# Patient Record
Sex: Male | Born: 1985 | Race: White | Hispanic: No | Marital: Married | Smoking: Never smoker
Health system: Southern US, Community
[De-identification: ages and names within clinical notes are randomized; demographics above are authoritative.]

---

## 2015-08-27 ENCOUNTER — Emergency Department (HOSPITAL_COMMUNITY)
Admission: EM | Admit: 2015-08-27 | Discharge: 2015-08-27 | Disposition: A | Discharge status: Home / Self Care | Payer: BLUE CROSS/BLUE SHIELD | Attending: Emergency Medicine | Admitting: Emergency Medicine

## 2015-08-27 ENCOUNTER — Emergency Department (HOSPITAL_COMMUNITY): Payer: BLUE CROSS/BLUE SHIELD

## 2015-08-27 ENCOUNTER — Encounter (HOSPITAL_COMMUNITY): Payer: Self-pay | Admitting: *Deleted

## 2015-08-27 DIAGNOSIS — M791 Myalgia: Secondary | ICD-10-CM | POA: Insufficient documentation

## 2015-08-27 DIAGNOSIS — R110 Nausea: Secondary | ICD-10-CM | POA: Insufficient documentation

## 2015-08-27 DIAGNOSIS — R109 Unspecified abdominal pain: Secondary | ICD-10-CM | POA: Insufficient documentation

## 2015-08-27 DIAGNOSIS — R079 Chest pain, unspecified: Secondary | ICD-10-CM | POA: Diagnosis present

## 2015-08-27 DIAGNOSIS — R42 Dizziness and giddiness: Secondary | ICD-10-CM | POA: Insufficient documentation

## 2015-08-27 LAB — CBC
HCT: 45.4 % (ref 39.0–52.0)
Hemoglobin: 15.9 g/dL (ref 13.0–17.0)
MCH: 29.8 pg (ref 26.0–34.0)
MCHC: 35 g/dL (ref 30.0–36.0)
MCV: 85 fL (ref 78.0–100.0)
PLATELETS: 242 10*3/uL (ref 150–400)
RBC: 5.34 MIL/uL (ref 4.22–5.81)
RDW: 12.3 % (ref 11.5–15.5)
WBC: 6.5 10*3/uL (ref 4.0–10.5)

## 2015-08-27 LAB — BASIC METABOLIC PANEL
Anion gap: 11 (ref 5–15)
BUN: 15 mg/dL (ref 6–20)
CALCIUM: 10.2 mg/dL (ref 8.9–10.3)
CHLORIDE: 99 mmol/L — AB (ref 101–111)
CO2: 30 mmol/L (ref 22–32)
CREATININE: 1.52 mg/dL — AB (ref 0.61–1.24)
GFR calc Af Amer: >60 mL/min (ref >60)
GFR calc non Af Amer: >60 mL/min (ref >60)
GLUCOSE: 108 mg/dL — AB (ref 65–99)
Potassium: 4.2 mmol/L (ref 3.5–5.1)
Sodium: 140 mmol/L (ref 135–145)

## 2015-08-27 LAB — I-STAT TROPONIN, ED: TROPONIN I, POC: 0 ng/mL (ref 0.00–0.08)

## 2015-08-27 MED ORDER — ACETAMINOPHEN 500 MG PO TABS: 500.0000 mg | ORAL_TABLET | Freq: Four times a day (QID) | ORAL | Status: AC | PRN

## 2015-08-27 NOTE — ED Notes (Signed)
Pt c/o sudden left sided chest pain, started about an hour prior to arrival to ED. Pt denies chest pain at this time. Pt only report lightheadedness and dizziness. Pt states the pain was sharp lasting a few seconds. Pt states this is the third time this has happened.

## 2015-08-27 NOTE — Discharge Instructions (Signed)
1. Medications: tylenol, usual home medications 2. Treatment: rest, drink plenty of fluids 3. Follow Up: please followup with your primary doctor for discussion of your diagnoses and further evaluation after today's visit; if you do not have a primary care doctor use the resource guide provided to find one; please return to the ER for severe pain or shortness of breath, high fever, loss of consciousness, new or worsening symptoms   Nonspecific Chest Pain It is often hard to find the cause of chest pain. There is always a chance that your pain could be related to something serious, such as a heart attack or a blood clot in your lungs. Chest pain can also be caused by conditions that are not life-threatening. If you have chest pain, it is very important to follow up with your doctor.  HOME CARE  If you were prescribed an antibiotic medicine, finish it all even if you start to feel better.  Avoid any activities that cause chest pain.  Do not use any tobacco products, including cigarettes, chewing tobacco, or electronic cigarettes. If you need help quitting, ask your doctor.  Do not drink alcohol.  Take medicines only as told by your doctor.  Keep all follow-up visits as told by your doctor. This is important. This includes any further testing if your chest pain does not go away.  Your doctor may tell you to keep your head raised (elevated) while you sleep.  Make lifestyle changes as told by your doctor. These may include:  Getting regular exercise. Ask your doctor to suggest some activities that are safe for you.  Eating a heart-healthy diet. Your doctor or a diet specialist (dietitian) can help you to learn healthy eating options.  Maintaining a healthy weight.  Managing diabetes, if necessary.  Reducing stress. GET HELP IF:  Your chest pain does not go away, even after treatment.  You have a rash with blisters on your chest.  You have a fever. GET HELP RIGHT AWAY IF:  Your  chest pain is worse.  You have an increasing cough, or you cough up blood.  You have severe belly (abdominal) pain.  You feel extremely weak.  You pass out (faint).  You have chills.  You have sudden, unexplained chest discomfort.  You have sudden, unexplained discomfort in your arms, back, neck, or jaw.  You have shortness of breath at any time.  You suddenly start to sweat, or your skin gets clammy.  You feel nauseous.  You vomit.  You suddenly feel light-headed or dizzy.  Your heart begins to beat quickly, or it feels like it is skipping beats. These symptoms may be an emergency. Do not wait to see if the symptoms will go away. Get medical help right away. Call your local emergency services (911 in the U.S.). Do not drive yourself to the hospital.   This information is not intended to replace advice given to you by your health care provider. Make sure you discuss any questions you have with your health care provider.   Document Released: 04/04/2008 Document Revised: 11/07/2014 Document Reviewed: 05/23/2014 Elsevier Interactive Patient Education 2016 [employer].   Emergency Department Resource Guide 1) Find a Doctor and Pay Out of Pocket Although you won't have to find out who is covered by your insurance plan, it is a good idea to ask around and get recommendations. You will then need to call the office and see if the doctor you have chosen will accept you as a new patient and  what types of options they offer for patients who are self-pay. Some doctors offer discounts or will set up payment plans for their patients who do not have insurance, but you will need to ask so you aren't surprised when you get to your appointment.  2) Contact Your Local Health Department Not all health departments have doctors that can see patients for sick visits, but many do, so it is worth a call to see if yours does. If you don't know where your local health department is, you can check in  your phone book. The CDC also has a tool to help you locate your state's health department, and many state websites also have listings of all of their local health departments.  3) Find a Walk-in Clinic If your illness is not likely to be very severe or complicated, you may want to try a walk in clinic. These are popping up all over the country in pharmacies, drugstores, and shopping centers. They're usually staffed by nurse practitioners or physician assistants that have been trained to treat common illnesses and complaints. They're usually fairly quick and inexpensive. However, if you have serious medical issues or chronic medical problems, these are probably not your best option.  No Primary Care Doctor: - Call Health Connect at  [phone] - they can help you locate a primary care doctor that  accepts your insurance, provides certain services, etc. - Physician Referral Service- [phone]  Chronic Pain Problems: Organization         Address  Phone   Notes  [facility]  [phone] Patients need to be referred by their primary care doctor.   Medication Assistance: Organization         Address  Phone   Notes  [facility] [address] [place], [place] [address] [phone] --Must be a resident of [facility] -- Must have NO insurance coverage whatsoever (no Medicaid/ Medicare, etc.) -- The pt. MUST have a primary care doctor that directs their care regularly and follows them in the community   [facility]ist  [phone]   [employer]  [phone]    Agencies that provide inexpensive medical care: Organization         Address  Phone   Notes  [facility]  [phone]   [facility]    [phone]   [facility] [address] [place], [place] [address] [phone]   [facility] [address], [place] [phone]    [facility]    [phone]   [facility]    [phone]   [facility]  [address], [place] Phone:  [phone], Fax:  [phone] Hours of Operation:  9 am - 6 pm, M-F.  Also accepts Medicaid/Medicare and self-pay.  [facility]  [address],  Phone: [phone], Fax: [phone]. Hours of Operation:  8:30 am - 5:30 pm, M-F.  Also accepts Medicaid and self-pay.  [facility] [place] [address], [place] Phone: [phone]   [facility] [address], [place], [place] [phone], Ext. [phone] Mondays & Thursdays: 7-9 AM.  First 15 patients are seen on a first come, first serve basis.    Medicaid-accepting [facility] Providers:  Retail buyerrganization         Address  Phone  Notes  [facility] [address], [place] [phone] Also accepts self-pay patients.  [facility] [address] Laurell Josephs [place], [place]  [phone]   [facility] [address], [place] [phone]   [facility] [address] [place] [phone]   Renaye Rakers [address], [place]   [phone] Only accepts [place] patients after they have their name applied to their card.   Self-Pay (no insurance) in [facility]:  Organization         Address  Phone   Notes  Sickle Cell Patients, [facility] [address] [place], [place] [phone]   [facility] [address] [place], [place] [phone]   [facility]  [address], [place] [phone]   [facility]/Dr. Osei-Bonsu  [address] [place] or [address], [place] [phone] Phone number for both [place] and [place] locations is the  same.  [facility] [address] [place] [phone]   [facility] [address] [place] or [address] [phone]   [facility] [address] [place] [phone], phone; [phone], fax Sees patients 1st and 3rd Saturday of every month.  Must not qualify for public or private insurance (i.e. Medicaid, Medicare, [place] Health Choice, Veterans' Benefits)  Household income should be no more than 200% of the poverty level The clinic cannot treat you if you are pregnant or think you are pregnant  Sexually transmitted diseases are not treated at the clinic.    Dental Care: Organization         Address  Phone  Notes  [facility] [address] [place], [place] [phone] Accepts children up to age 66 who are enrolled in [place] or [place] Health Choice; pregnant women with a Medicaid card; and children who have applied for Medicaid or [place] Health Choice, but were declined, whose parents can pay a reduced fee at time of service.  [facility]  [address], [place] [phone] Accepts children up to age 25 who are enrolled in [place] or [place] Health Choice; pregnant women with a Medicaid card; and children who have applied for Medicaid or [place] Health Choice, but were declined, whose parents can pay a reduced fee at time of service.  [facility]  [address] [place], [place] [phone] Patients are seen by appointment only. Walk-ins are not accepted. [facility] will see patients 46 years of age and older. Monday - Tuesday (8am-5pm) Most Wednesdays (8:30-5pm) $30 per visit, cash only  [facility]  [address], [facility] [phone] Patients are seen by appointment only. Walk-ins are not accepted. [facility] will see patients  81 years of age and older. One Wednesday Evening (Monthly: Volunteer Based).  $30 per visit, cash only  [employer]  [phone] for adults; Children under age 68, call Graduate Pediatric Dentistry at [phone]. Children aged 4-14, please call [phone] to request a pediatric application.  Dental services are provided in all areas of dental care including fillings, crowns and bridges, complete  and partial dentures, implants, gum treatment, root canals, and extractions. Preventive care is also provided. Treatment is provided to both adults and children. °Patients are selected via a lottery and there is often a waiting list. °  °[facility] [address], °[place] ° [phone] [email or web address] °  °[facility] [address], [place], [place] [phone], Ext. [phone] Second and Fourth Thursday of each month, opens at 6:30 AM; Clinic ends at 9 AM.  Patients are seen on a first-come first-served basis, and a limited number are seen during each clinic.  ° °[facility] ° [address], [place], [place] [phone]   Eligibility Requirements °You must have lived in [place], [place], or [place] counties for at least the last three months. °  You cannot be eligible for state or federal sponsored healthcare insurance, including Veterans Administration, Medicaid, or Medicare. °  You generally cannot be eligible for healthcare insurance through your employer.  °  How to apply: °Eligibility screenings are held every Tuesday and Wednesday afternoon from 1:00 pm until 4:00 pm. You do not need an appointment for the interview!  °[facility] [address], [place], [place] [phone]   °[facility]  [phone]   °[facility]  [phone]   °[facility]  [phone]   ° °Behavioral Health Resources in the Community: °Intensive Outpatient  Programs °Organization         Address  Phone  Notes  °[facility] [address], [place], [place] [phone]   °[facility] [address], [place], [place] [phone]   °[facility] [address], [place], [place] ° [phone]   °[facility] [address],  °[place], [place] 1-800-853-5163 or 336-641-4981   °Substance Abuse Resources °Organization         Address  Phone  Notes  °[facility]  336-882-2125   °[facility]  336-784-9470   °[facility]  336-285-9073   °[facility]  336-845-3988   °Residential & Outpatient Substance Abuse Program  1-800-659-3381   °Psychological Services °Organization         Address  Phone  Notes  ° Health  336- [phone]   °[facility]  336- 378-7881   °[facility] [address], [place] 1-800-853-5163 or 336-641-4981   ° °Mobile Crisis Teams °Organization         Address  Phone  Notes  °[facility], Mobile Crisis Care Unit  1-877-626-1772   °Assertive °[facility] Services ° [address] [place], [place] 336-834-9664   °Sharon DeEsch [address] °[place] 336-554-5454   ° °Self-Help/Support Groups °Organization         Address  Phone             Notes  °[facility] [place] - variety of support groups  336- 373-1402 Call for more information  °Narcotics Anonymous (NA), [facility] [address], °[place]  2 meetings at this location  ° °Residential Treatment Programs °Organization         Address  Phone  Notes  °[facility] [address],    ° [place]  1-866-801-8205   °[facility] ° [address], [place],  [phone]   °[facility] [address], [place] 336-845-3988 Admissions: 8am-3pm M-F  °  [facility] [address]    °[place], [place]  336-841-1104   °[facility] [address], [place], [place] 336-379-7146   °[facility] [address]  °[place], [place] 336-285-9073   °[facility] - Intensive Outpatient [address], [place], [place] 336-852-3033   °[facility] ([facility] [address]  °[place], [place] 1-877-615-2722 or 336-784-9470   °[facility] ([facility]) [address] [place], [place] 336-227-7417 Accepts Medicaid  °[facility] [address]  °[place] 1-800-659-3381 Substance Abuse/Addiction Treatment  ° °[place] Behavioral Health Resources °Organization         Address  Phone  Notes  °[facility]  (888) 581-9988   °Julie Brannon, PhD [address] [place], [place]   (336) 349-5553 or (336) 951-0000   ° Behavioral   [address] °[place], [place] (336) 349-4454   °[facility] [address], [place], [place] (336) 342-8316 Insurance/Medicaid/sponsorship through [facility]  °[facility] [address]                                    [place], [place] (336) 342-8316 Therapy/tele-psych/case  °[facility] [address]  ° [place],  (336) 349-2233    °Dr. Arfeen  (336) 349-4544   °[facility] [place]  [employer] [facility] [facility] 1) [address], [place] °2) [address], [place] °3)  [address], [place] (336) 349-3220 °(336) 342-7768 ° °(336) 342-8140   °[place] Child Abuse Hotline (336) 342-1394 or (336) 342-3537 (After Hours)    ° ° ° °

## 2015-08-27 NOTE — ED Provider Notes (Signed)
CSN: [id number]     Arrival date & time 08/27/15  1558 History   First MD Initiated Contact with Patient 08/27/15 1715     Chief Complaint  Patient presents with  . Chest Pain  . Dizziness     HPI    Alfred Nguyen is a 29 y.o. male with no pertinent PMH who presents to the ED with intermittent left-sided chest pain x 2 days. He characterizes his pain as sharp and stabbing, and states his symptoms last for "a few seconds" at a time. He reports he experiences pain both at rest and with exertion. He states he has not tried anything for symptom relief. He reports dizziness and lightheadedness. He also states he experiences intermittent abdominal pain and nausea. He denies headache, cough, congestion, shortness of breath, diarrhea, constipation, numbness, weakness. He denies recent travel or immobility, recent surgery, history of malignancy, history of DVT/PE.   History reviewed. No pertinent past medical history. History reviewed. No pertinent past surgical history. History reviewed. No pertinent family history. Social History  Substance Use Topics  . Smoking status: Never Smoker   . Smokeless tobacco: Never Used  . Alcohol Use: Yes      Review of Systems  Constitutional: Negative for fever and chills.  HENT: Negative for congestion.   Respiratory: Negative for cough and shortness of breath.   Cardiovascular: Positive for chest pain. Negative for leg swelling.  Gastrointestinal: Positive for nausea and abdominal pain. Negative for vomiting, diarrhea and constipation.  Musculoskeletal: Positive for myalgias.  Neurological: Positive for dizziness and light-headedness. Negative for syncope, weakness, numbness and headaches.  All other systems reviewed and are negative.     Allergies  Review of patient's allergies indicates no known allergies.  Home Medications   Prior to Admission medications   Not on File    BP 140/87 mmHg  Pulse 71  Temp(Src) 98.3 F (36.8 C) (Oral)   Resp 20  Ht 5\' 7"  (1.702 m)  Wt 150 lb (68.04 kg)  BMI 23.49 kg/m2  SpO2 97% Physical Exam  Constitutional: He is oriented to person, place, and time. He appears well-developed and well-nourished. No distress.  HENT:  Head: Normocephalic and atraumatic.  Right Ear: External ear normal.  Left Ear: External ear normal.  Nose: Nose normal.  Mouth/Throat: Uvula is midline, oropharynx is clear and moist and mucous membranes are normal.  Eyes: Conjunctivae, EOM and lids are normal. Pupils are equal, round, and reactive to light. Right eye exhibits no discharge. Left eye exhibits no discharge. No scleral icterus.  Neck: Normal range of motion. Neck supple.  Cardiovascular: Normal rate, regular rhythm, normal heart sounds, intact distal pulses and normal pulses.   Pulmonary/Chest: Effort normal and breath sounds normal. No respiratory distress. He has no wheezes. He has no rales. He exhibits no tenderness.  Abdominal: Soft. Normal appearance and bowel sounds are normal. He exhibits no distension and no mass. There is no tenderness. There is no rigidity, no rebound and no guarding.  Musculoskeletal: Normal range of motion. He exhibits no edema or tenderness.  Neurological: He is alert and oriented to person, place, and time. He has normal strength. No cranial nerve deficit or sensory deficit.  Skin: Skin is warm, dry and intact. No rash noted. He is not diaphoretic. No erythema. No pallor.  Psychiatric: He has a normal mood and affect. His speech is normal and behavior is normal.  Nursing note and vitals reviewed.   ED Course  Procedures (including critical care  time)  Labs Review Labs Reviewed  BASIC METABOLIC PANEL - Abnormal; Notable for the following:    Chloride 99 (*)    Glucose, Bld 108 (*)    Creatinine, Ser 1.52 (*)    All other components within normal limits  CBC  I-STAT TROPOININ, ED    Imaging Review Dg Chest 2 View  08/27/2015  CLINICAL DATA:  Left-sided chest pain.  EXAM: CHEST  2 VIEW COMPARISON:  None. FINDINGS: Cardiomediastinal silhouette is normal. Mediastinal contours appear intact. There is no evidence of focal airspace consolidation, pleural effusion or pneumothorax. Osseous structures are without acute abnormality. Soft tissues are grossly normal. IMPRESSION: No radiographic evidence of acute cardiopulmonary abnormality. Electronically Signed   By: Ted Mcalpine M.D.   On: 08/27/2015 16:36   I have personally reviewed and evaluated these images and lab results as part of my medical decision-making.   EKG Interpretation   Date/Time:  Thursday August 27 2015 16:08:08 EDT Ventricular Rate:  78 PR Interval:  122 QRS Duration: 90 QT Interval:  334 QTC Calculation: 380 R Axis:   116 Text Interpretation:  Normal sinus rhythm with sinus arrhythmia Right axis  deviation Abnormal ECG Confirmed by Fayrene Fearing  MD, MARK ([id number]) on 08/27/2015  4:11:13 PM      MDM   Final diagnoses:  Chest pain, unspecified chest pain type    29 year old male presents with intermittent left-sided chest pain for the past 2 days. Reports lightheadedness and dizziness. States he experiences intermittent abdominal pain and nausea. Denies headache, cough, congestion, shortness of breath, diarrhea, constipation, numbness, weakness, recent travel or immobility, recent surgery, history of malignancy, history of DVT/PE.   Patient is afebrile. Vital signs stable. Heart regular rate and rhythm. Lungs clear to auscultation bilaterally. No TTP of chest wall. Abdomen soft, nontender, nondistended. No rebound, guarding, or masses. Patient moves all extremities without difficulty. Normal neuro exam with no focal deficit.  EKG sinus rhythm, heart rate 78. Troponin negative. CBC negative for leukocytosis or anemia. BMP remarkable for creatinine 1.52.  CXR negative for consolidation, pleural effusion, pneumothorax. PERC negative. Low suspicion for PE. HEART score 0. Given negative  work-up in the ED and duration of time since symptom onset, doubt ACS.  Patient is well-appearing, feel he is stable for discharge at this time. Will treat pain with tylenol. Patient to follow-up with PCP for further evaluation and management and for repeat creatinine. Return precautions discussed.  BP 148/93 mmHg  Pulse 76  Temp(Src) 98.3 F (36.8 C) (Oral)  Resp 18  Ht  (1.702 m)  Wt 150 lb (68.04 kg)  BMI 23.49 kg/m2  SpO2 98%    Mady Gemma, PA-C 08/28/15 0100  Arby Barrette, MD 09/01/15 [phone]

## 2016-11-24 IMAGING — CR DG CHEST 2V
2 series · 2 of 2 positions shown · non-contrast
Comparison: None.

CLINICAL DATA: Left-sided chest pain.

EXAM:
CHEST  2 VIEW

[chest pa]
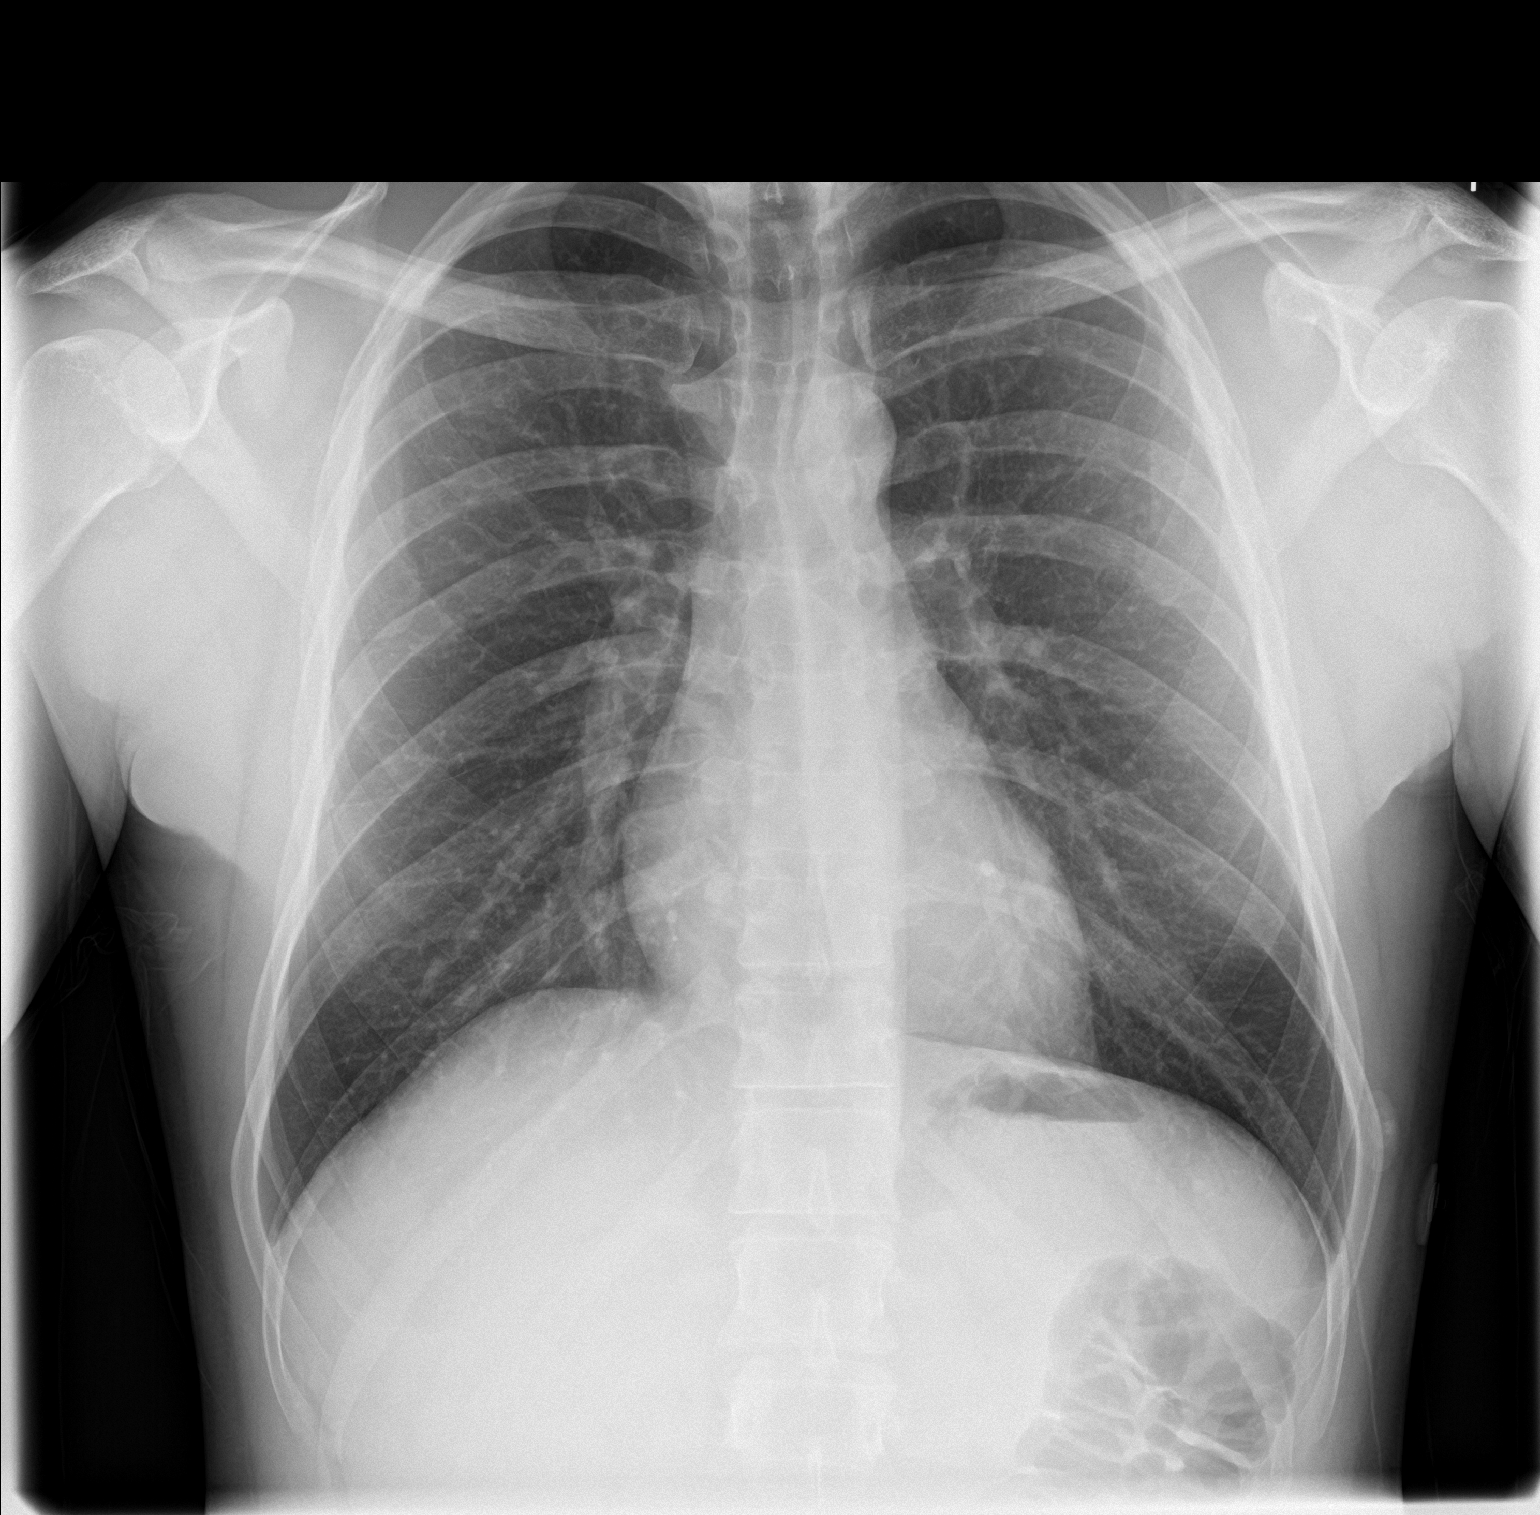

[chest lat]
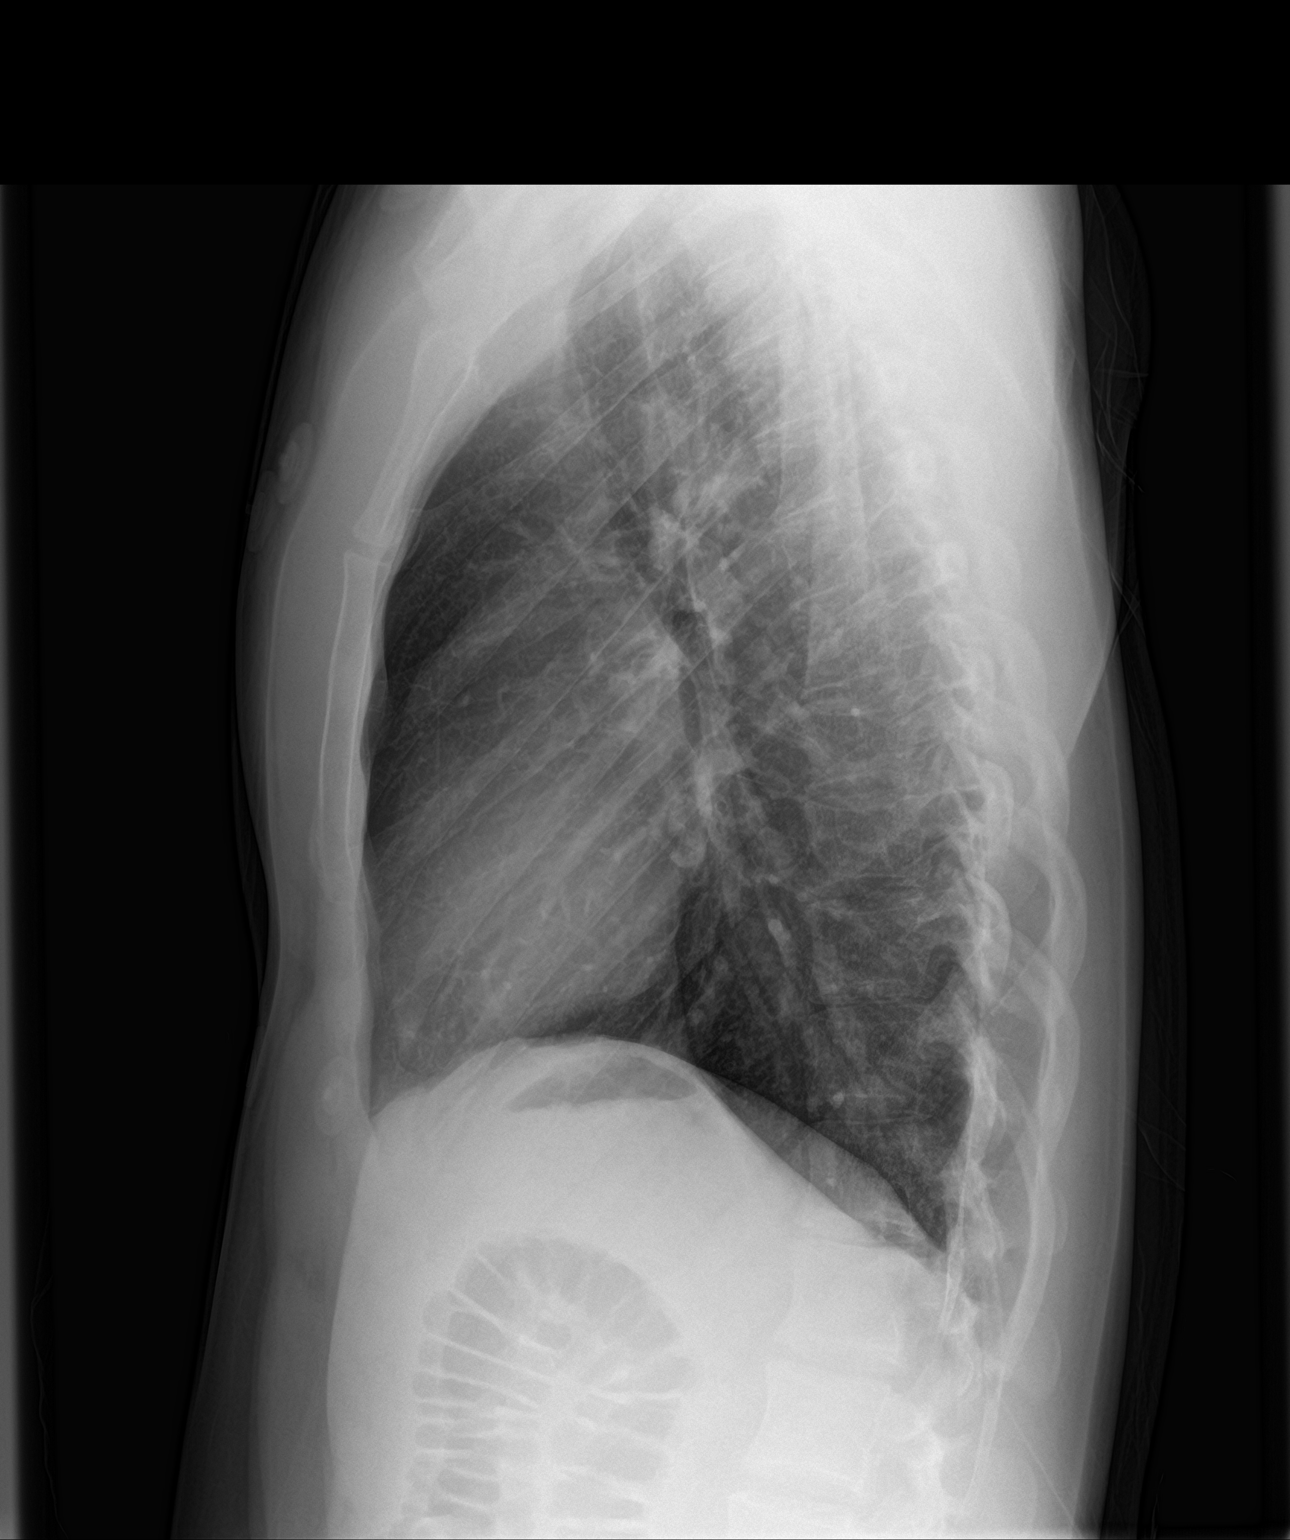

[2 of 2 positions shown; findings below may reference images not displayed]

FINDINGS: Cardiomediastinal silhouette is normal. Mediastinal contours appear
intact.

There is no evidence of focal airspace consolidation, pleural
effusion or pneumothorax.

Osseous structures are without acute abnormality. Soft tissues are
grossly normal.
IMPRESSION: No radiographic evidence of acute cardiopulmonary abnormality.
